# Patient Record
Sex: Male | Born: 1980 | Race: White | Hispanic: No | Marital: Single | Smoking: Current every day smoker
Health system: Southern US, Academic
[De-identification: ages and names within clinical notes are randomized; demographics above are authoritative.]

## PROBLEM LIST (undated history)

## (undated) DIAGNOSIS — Z789 Other specified health status: Secondary | ICD-10-CM

## (undated) HISTORY — PX: HX NO SURGICAL PROCEDURES: 2100001501

## (undated) HISTORY — PX: NO PAST SURGERIES: SHX2092

---

## 2009-12-22 ENCOUNTER — Emergency Department (HOSPITAL_COMMUNITY)
Admission: EM | Admit: 2009-12-22 | Discharge: 2009-12-22 | Payer: Self-pay | Source: Home / Self Care | Admitting: Emergency Medicine

## 2010-03-20 LAB — POCT I-STAT, CHEM 8
Calcium, Ion: 1.2 mmol/L (ref 1.12–1.32)
HCT: 47 % (ref 39.0–52.0)
TCO2: 31 mmol/L (ref 0–100)

## 2010-03-20 LAB — D-DIMER, QUANTITATIVE: D-Dimer, Quant: <0.22 ug/mL-FEU (ref 0.00–0.48)

## 2011-06-11 IMAGING — CR DG THORACIC SPINE 2V
4 series · 4 of 4 positions shown · non-contrast
Comparison: None

CLINICAL DATA: Mid back pain.

THORACIC SPINE - 2 VIEW

[view not recorded (1 of 4)]
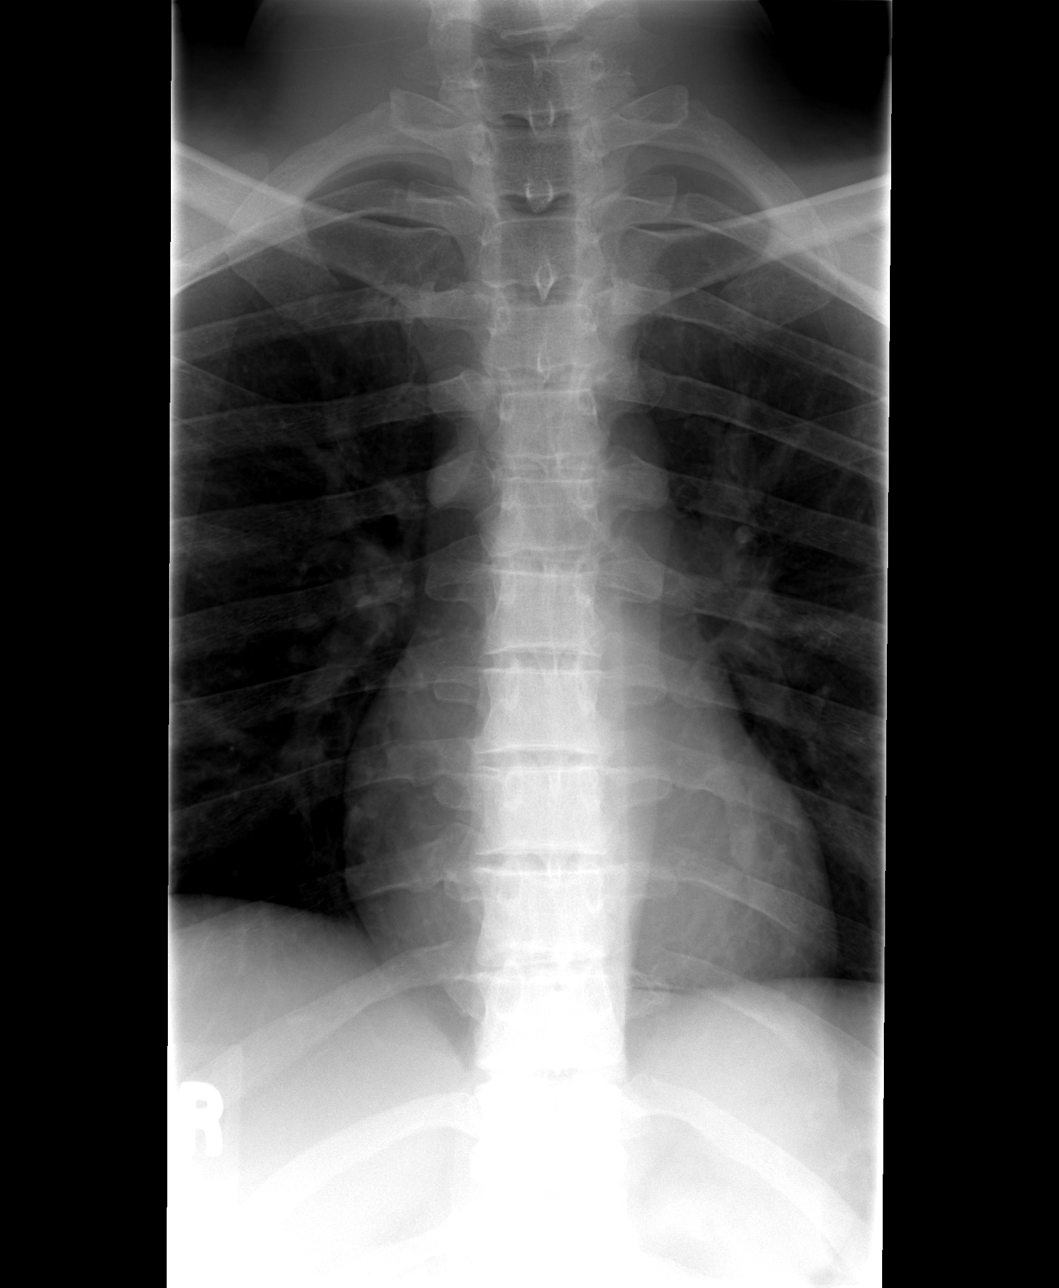

[view not recorded (2 of 4)]
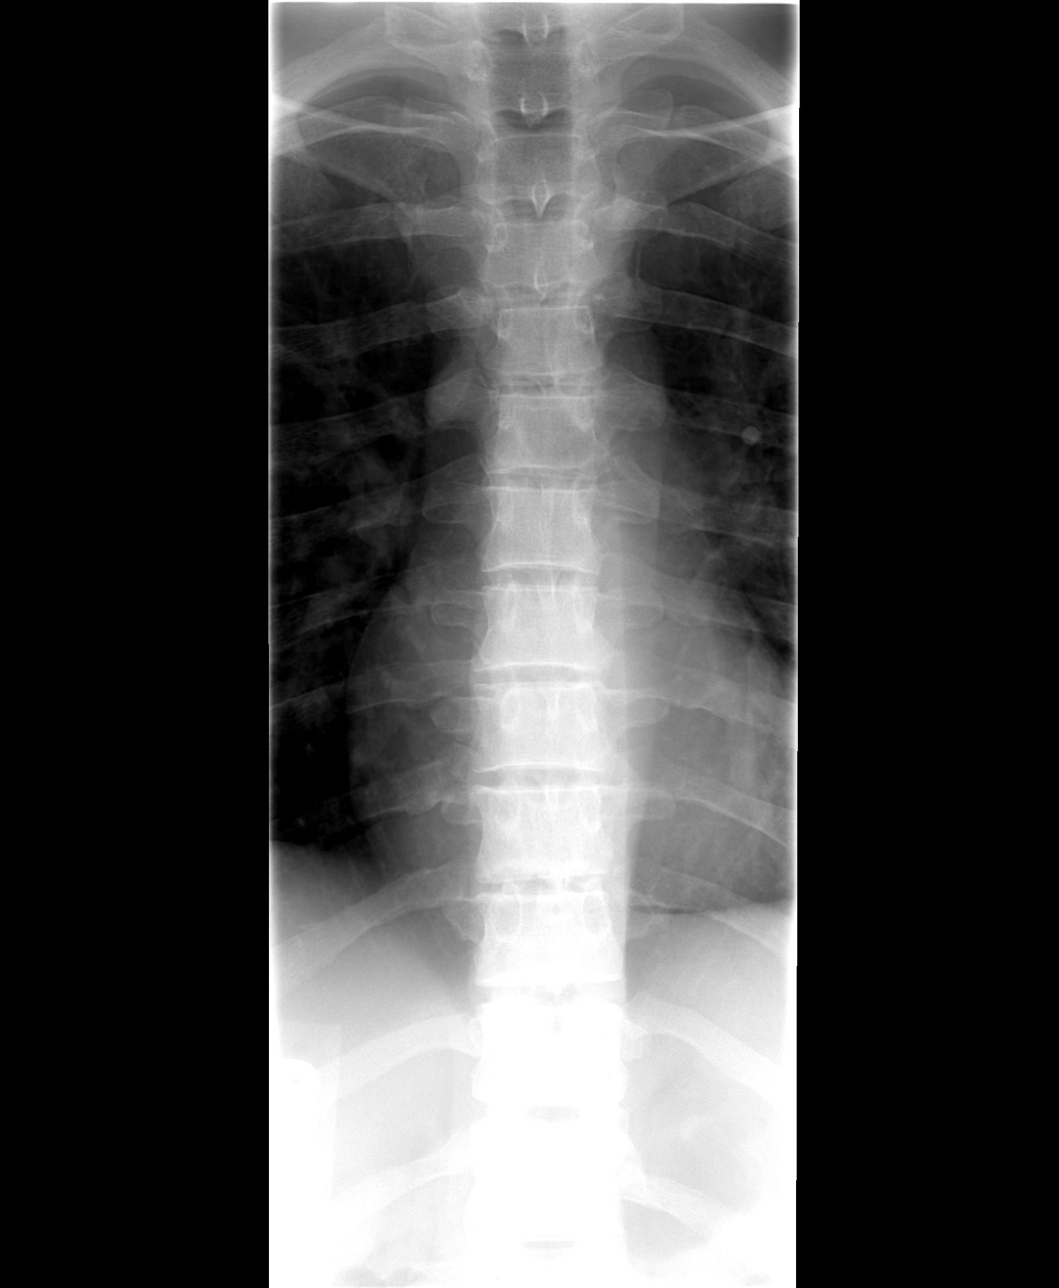

[view not recorded (3 of 4)]
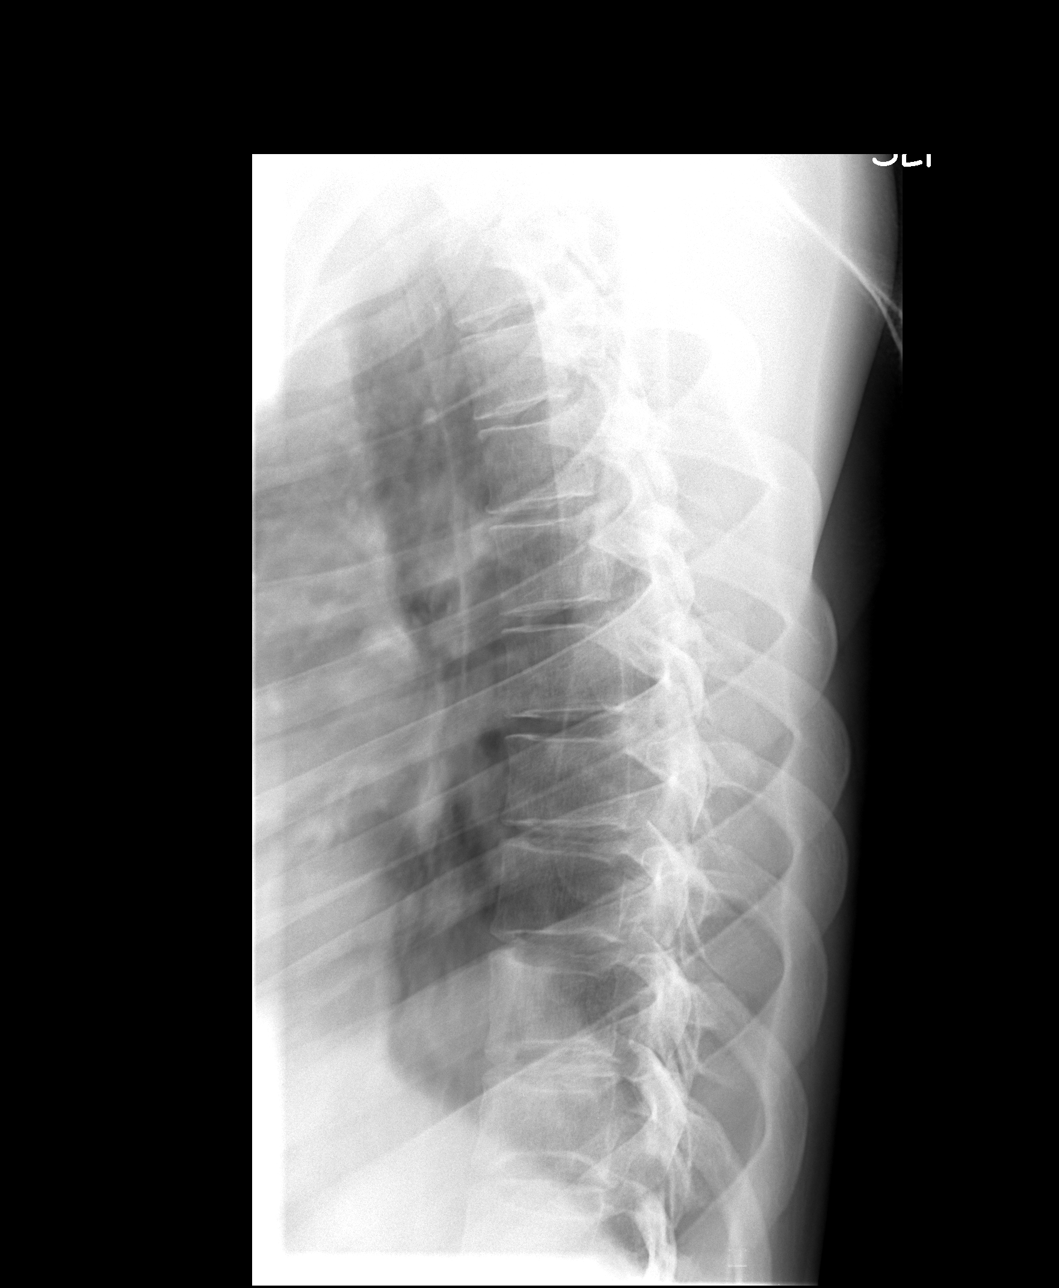

[view not recorded (4 of 4)]
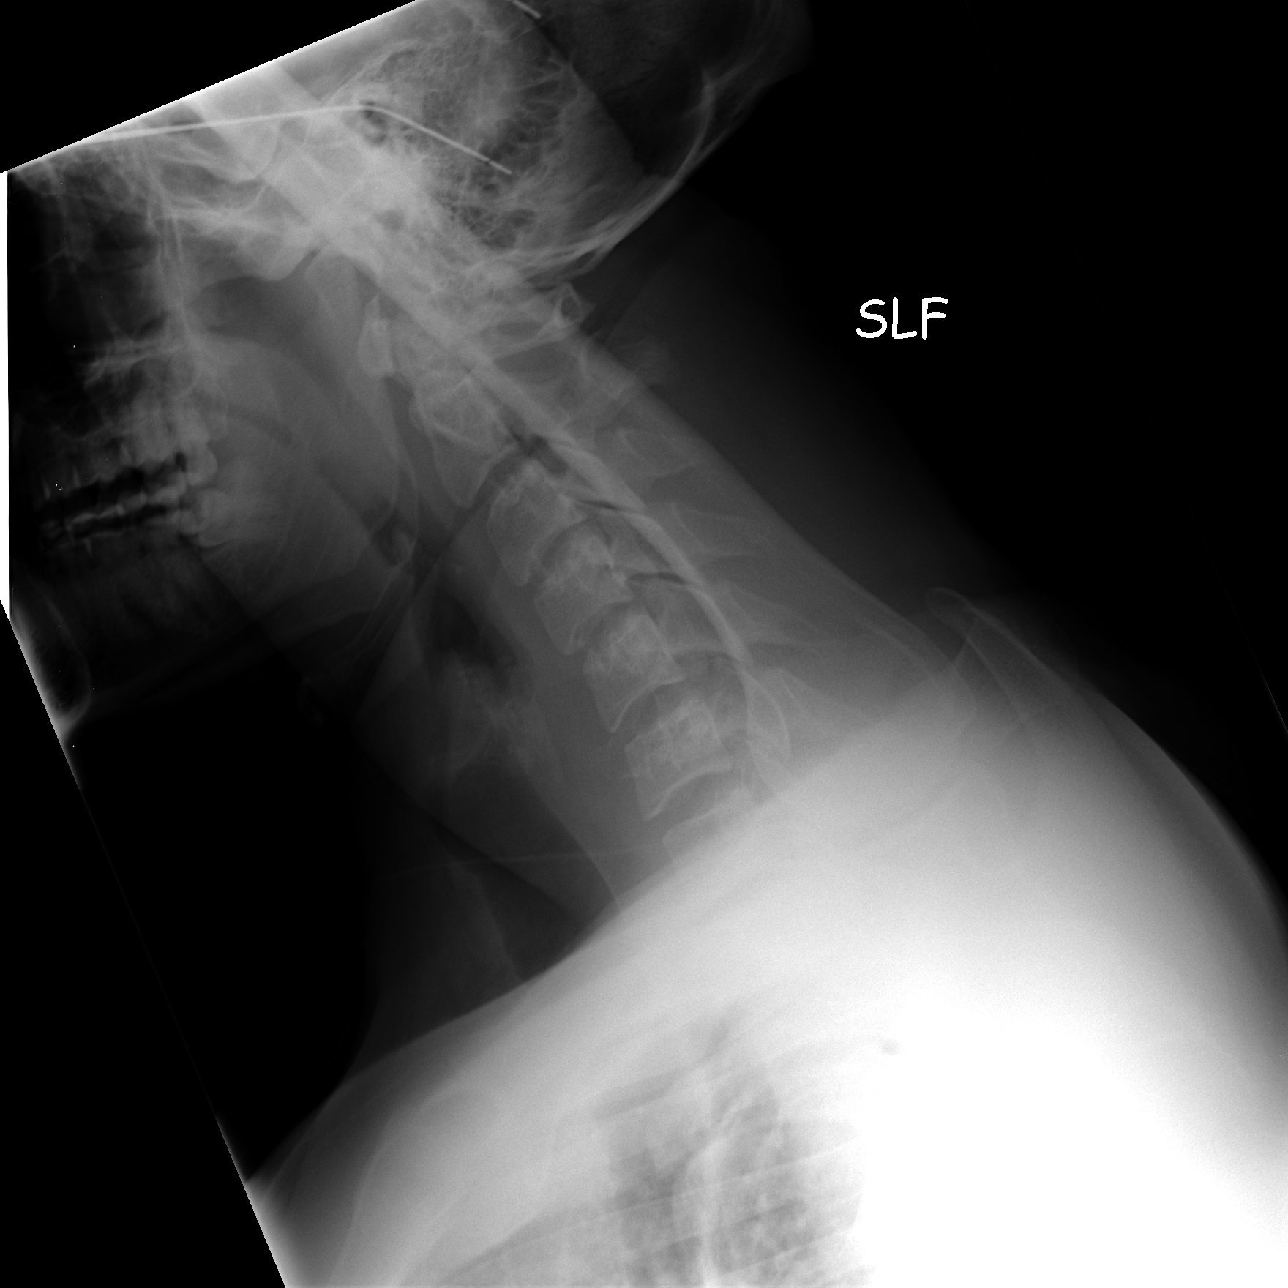

[4 of 4 positions shown; findings below may reference images not displayed]

FINDINGS: No acute bony abnormality.  Specifically, no fracture or
malalignment.  No significant degenerative disease.
IMPRESSION: Negative.

## 2014-01-12 ENCOUNTER — Emergency Department (HOSPITAL_COMMUNITY): Payer: 59

## 2014-01-12 ENCOUNTER — Emergency Department (HOSPITAL_COMMUNITY)
Admission: EM | Admit: 2014-01-12 | Discharge: 2014-01-12 | Disposition: A | Discharge status: Home / Self Care | Payer: 59 | Attending: Emergency Medicine | Admitting: Emergency Medicine

## 2014-01-12 ENCOUNTER — Encounter (HOSPITAL_COMMUNITY): Payer: Self-pay | Admitting: Emergency Medicine

## 2014-01-12 DIAGNOSIS — Y9389 Activity, other specified: Secondary | ICD-10-CM | POA: Insufficient documentation

## 2014-01-12 DIAGNOSIS — Y998 Other external cause status: Secondary | ICD-10-CM | POA: Diagnosis not present

## 2014-01-12 DIAGNOSIS — S51012A Laceration without foreign body of left elbow, initial encounter: Secondary | ICD-10-CM | POA: Diagnosis not present

## 2014-01-12 DIAGNOSIS — Y92410 Unspecified street and highway as the place of occurrence of the external cause: Secondary | ICD-10-CM | POA: Diagnosis not present

## 2014-01-12 DIAGNOSIS — S51002A Unspecified open wound of left elbow, initial encounter: Secondary | ICD-10-CM

## 2014-01-12 LAB — COMPREHENSIVE METABOLIC PANEL
ALT: 30 U/L (ref 0–53)
AST: 32 U/L (ref 0–37)
Albumin: 4.6 g/dL (ref 3.5–5.2)
Alkaline Phosphatase: 62 U/L (ref 39–117)
Anion gap: 10 (ref 5–15)
BUN: 21 mg/dL (ref 6–23)
CALCIUM: 9.3 mg/dL (ref 8.4–10.5)
CO2: 23 mmol/L (ref 19–32)
CREATININE: 1.26 mg/dL (ref 0.50–1.35)
Chloride: 104 mEq/L (ref 96–112)
GFR, EST AFRICAN AMERICAN: 86 mL/min — AB (ref >90)
GFR, EST NON AFRICAN AMERICAN: 74 mL/min — AB (ref >90)
GLUCOSE: 130 mg/dL — AB (ref 70–99)
Potassium: 3.8 mmol/L (ref 3.5–5.1)
Sodium: 137 mmol/L (ref 135–145)
TOTAL PROTEIN: 7.1 g/dL (ref 6.0–8.3)
Total Bilirubin: 0.7 mg/dL (ref 0.3–1.2)

## 2014-01-12 LAB — ETHANOL: Alcohol, Ethyl (B): <5 mg/dL (ref 0–9)

## 2014-01-12 MED ORDER — LIDOCAINE HCL (PF) 1 % IJ SOLN
INTRAMUSCULAR | Status: AC
  Filled 2014-01-12: qty 5

## 2014-01-12 MED ORDER — HYDROMORPHONE HCL 1 MG/ML IJ SOLN
1.0000 mg | Freq: Once | INTRAMUSCULAR | Status: AC
  Administered 2014-01-12: 1 mg via INTRAVENOUS

## 2014-01-12 MED ORDER — TETANUS-DIPHTH-ACELL PERTUSSIS 5-2.5-18.5 LF-MCG/0.5 IM SUSP
0.5000 mL | Freq: Once | INTRAMUSCULAR | Status: AC
  Administered 2014-01-12: 0.5 mL via INTRAMUSCULAR
  Filled 2014-01-12: qty 0.5

## 2014-01-12 MED ORDER — LIDOCAINE HCL (PF) 1 % IJ SOLN
30.0000 mL | Freq: Once | INTRAMUSCULAR | Status: AC
  Administered 2014-01-12: 30 mL via INTRADERMAL
  Filled 2014-01-12: qty 30

## 2014-01-12 MED ORDER — CEPHALEXIN 250 MG PO CAPS
500.0000 mg | ORAL_CAPSULE | Freq: Once | ORAL | Status: AC
  Administered 2014-01-12: 500 mg via ORAL
  Filled 2014-01-12: qty 2

## 2014-01-12 MED ORDER — HYDROMORPHONE HCL 1 MG/ML IJ SOLN
1.0000 mg | Freq: Once | INTRAMUSCULAR | Status: DC
  Filled 2014-01-12: qty 1

## 2014-01-12 MED ORDER — CEPHALEXIN 500 MG PO CAPS: 500.0000 mg | ORAL_CAPSULE | Freq: Two times a day (BID) | ORAL | Status: DC

## 2014-01-12 MED ORDER — SODIUM CHLORIDE 0.9 % IV BOLUS (SEPSIS)
1000.0000 mL | Freq: Once | INTRAVENOUS | Status: AC
  Administered 2014-01-12: 1000 mL via INTRAVENOUS

## 2014-01-12 MED ORDER — HYDROCODONE-ACETAMINOPHEN 5-325 MG PO TABS: 1.0000 | ORAL_TABLET | Freq: Four times a day (QID) | ORAL | Status: DC | PRN
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
## 2014-01-12 NOTE — ED Notes (Signed)
MD at bedside irrigating wound.

## 2014-01-12 NOTE — ED Notes (Signed)
Pt made aware to return if symptoms worsen or if any life threatening symptoms occur.   
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
## 2014-01-12 NOTE — ED Notes (Signed)
Wet to dry dressing applied.
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
## 2014-01-13 MED ORDER — HYDROCODONE-ACETAMINOPHEN 5-325 MG PO TABS: 2.0000 | ORAL_TABLET | Freq: Four times a day (QID) | ORAL | Status: DC | PRN

## 2014-01-22 ENCOUNTER — Ambulatory Visit (INDEPENDENT_AMBULATORY_CARE_PROVIDER_SITE_OTHER): Payer: 59 | Admitting: Emergency Medicine

## 2014-01-22 VITALS — BP 120/72 | HR 68 | Temp 98.0°F | Resp 18 | Ht 78.0 in | Wt 209.0 lb

## 2014-01-22 DIAGNOSIS — S51012A Laceration without foreign body of left elbow, initial encounter: Secondary | ICD-10-CM

## 2014-01-22 DIAGNOSIS — T8131XA Disruption of external operation (surgical) wound, not elsewhere classified, initial encounter: Secondary | ICD-10-CM

## 2014-01-22 MED ORDER — DAKINS 0.4-0.5 % EX SOLN: CUTANEOUS | Status: DC | PRN

## 2014-01-22 MED ORDER — HYDROCODONE-ACETAMINOPHEN 5-325 MG PO TABS: 2.0000 | ORAL_TABLET | ORAL | Status: DC | PRN

## 2014-01-22 NOTE — Patient Instructions (Signed)
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
## 2014-01-23 ENCOUNTER — Telehealth: Payer: Self-pay

## 2014-01-23 NOTE — Telephone Encounter (Signed)
Pt's wife tried to clean pt's wound last night with soap and water and he passed out from pain, this was after taking Vicodin first. Wants to know if she soul bring him in and let you clean it and evaluate him. Advised that this would be best. He will he here after lunch. Told clerical to fast track him
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
## 2014-01-23 NOTE — Telephone Encounter (Signed)
The patient did not come in today - please call and check on them.

## 2014-01-23 NOTE — Telephone Encounter (Signed)
Please call and get information for me.

## 2014-01-24 ENCOUNTER — Ambulatory Visit (INDEPENDENT_AMBULATORY_CARE_PROVIDER_SITE_OTHER): Payer: 59 | Admitting: Physician Assistant

## 2014-01-24 VITALS — BP 110/70 | HR 77 | Temp 98.1°F | Resp 18 | Ht 78.0 in | Wt 213.4 lb

## 2014-01-24 DIAGNOSIS — S51002S Unspecified open wound of left elbow, sequela: Secondary | ICD-10-CM

## 2014-01-24 DIAGNOSIS — T8133XD Disruption of traumatic injury wound repair, subsequent encounter: Secondary | ICD-10-CM

## 2014-01-24 NOTE — Progress Notes (Signed)
Procedure: Verbal consent obtained.  Patient anesthetized with roughly 10 cc of 0.5% Marcaine. The wound was scrubbed vigoursly with Chlorhexidine surgical scrub and then rinsed with normal saline. Necrotic tissue removed with a curette. Wound washed out with 60 cc syringe with splashed guard x 10. Wet dressing placed with sterile 4x4, and topped with clean 4x4 and non adhesive.  The area was wrapped with coban.  Patient tolerated procedure well.  Deliah BostonMichael Clark, MS, PA-C   10:27 AM, 01/24/2014
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
## 2014-01-25 NOTE — Telephone Encounter (Signed)
Pt was evaluated on Sunday.

## 2014-01-26 ENCOUNTER — Telehealth: Payer: Self-pay

## 2014-01-26 ENCOUNTER — Ambulatory Visit (INDEPENDENT_AMBULATORY_CARE_PROVIDER_SITE_OTHER): Payer: 59 | Admitting: Physician Assistant

## 2014-01-26 VITALS — BP 108/72 | HR 71 | Temp 98.2°F | Resp 18 | Ht 78.0 in | Wt 211.0 lb

## 2014-01-26 DIAGNOSIS — T8133XD Disruption of traumatic injury wound repair, subsequent encounter: Secondary | ICD-10-CM

## 2014-01-26 DIAGNOSIS — S51002S Unspecified open wound of left elbow, sequela: Secondary | ICD-10-CM

## 2014-01-26 NOTE — Progress Notes (Signed)
   Subjective:    Patient ID: Charles JeffersonMichael L Feld, male    DOB: 24-Feb-1980, 34 y.o.   MRN: [id number]  HPI Pt presents to clinic for a recheck.  They have been using the Dakins solution on the wound with good success.  Review of Systems  Constitutional: Negative for fever and chills.  Skin: Positive for wound.       Objective:   Physical Exam  Constitutional: He is oriented to person, place, and time. He appears well-developed and well-nourished.  BP 108/72 mmHg  Pulse 71  Temp(Src) 98.2 F (36.8 C) (Oral)  Resp 18  Ht 6\' 6"  (1.981 m)  Wt 211 lb (95.709 kg)  BMI 24.39 kg/m2  SpO2 97%   HENT:  Head: Normocephalic and atraumatic.  Right Ear: External ear normal.  Left Ear: External ear normal.  Pulmonary/Chest: Effort normal.  Neurological: He is alert and oriented to person, place, and time.  Skin: Skin is warm and dry.  Wound on left elbow - see photo in media - wound is 3x5cm.  Psychiatric: He has a normal mood and affect. His behavior is normal. Judgment and thought content normal.       Assessment & Plan:  Wound, open, elbow, left, sequela  Dehiscence of laceration repair, subsequent encounter   Continue Dakins solution wet-->dry drsgs.  We are going to refer to hand surgeon for debridement and closure.  Benny LennertSarah Yevonne Yokum PA-C  [facility] 01/26/2014 6:14 PM

## 2014-01-26 NOTE — Telephone Encounter (Signed)
Pt's wife called for Charles LennertSarah Atkins wanting to know about a referral concerning a skin graft. Please advise at [phone]

## 2014-01-28 ENCOUNTER — Other Ambulatory Visit (HOSPITAL_COMMUNITY): Payer: Self-pay | Admitting: Orthopaedic Surgery

## 2014-01-28 NOTE — Telephone Encounter (Signed)
appt for surgery Charles Atkins - on Tuesday (1/26) they are going to artificially graft skin to the area

## 2014-02-01 ENCOUNTER — Encounter (HOSPITAL_COMMUNITY): Payer: Self-pay | Admitting: *Deleted

## 2014-02-01 MED ORDER — CEFAZOLIN SODIUM-DEXTROSE 2-3 GM-% IV SOLR
2.0000 g | INTRAVENOUS | Status: AC
  Administered 2014-02-02: 2 g via INTRAVENOUS
  Filled 2014-02-01: qty 50

## 2014-02-02 ENCOUNTER — Ambulatory Visit (HOSPITAL_COMMUNITY): Payer: 59 | Admitting: Certified Registered Nurse Anesthetist

## 2014-02-02 ENCOUNTER — Encounter (HOSPITAL_COMMUNITY): Payer: Self-pay | Admitting: Certified Registered Nurse Anesthetist

## 2014-02-02 ENCOUNTER — Ambulatory Visit (HOSPITAL_COMMUNITY)
Admission: RE | Admit: 2014-02-02 | Discharge: 2014-02-02 | Disposition: A | Discharge status: Home / Self Care | Payer: 59 | Source: Ambulatory Visit | Attending: Orthopaedic Surgery | Admitting: Orthopaedic Surgery

## 2014-02-02 ENCOUNTER — Encounter (HOSPITAL_COMMUNITY)
Admission: RE | Disposition: A | Discharge status: Home / Self Care | Payer: Self-pay | Source: Ambulatory Visit | Attending: Orthopaedic Surgery

## 2014-02-02 DIAGNOSIS — L988 Other specified disorders of the skin and subcutaneous tissue: Secondary | ICD-10-CM

## 2014-02-02 DIAGNOSIS — S51802A Unspecified open wound of left forearm, initial encounter: Secondary | ICD-10-CM | POA: Diagnosis not present

## 2014-02-02 HISTORY — PX: SKIN SPLIT GRAFT: SHX444

## 2014-02-02 HISTORY — DX: Other specified health status: Z78.9

## 2014-02-02 LAB — CBC
HCT: 44.8 % (ref 39.0–52.0)
Hemoglobin: 15.3 g/dL (ref 13.0–17.0)
MCH: 29.8 pg (ref 26.0–34.0)
MCHC: 34.2 g/dL (ref 30.0–36.0)
MCV: 87.3 fL (ref 78.0–100.0)
Platelets: 256 10*3/uL (ref 150–400)
RBC: 5.13 MIL/uL (ref 4.22–5.81)
RDW: 12.8 % (ref 11.5–15.5)
WBC: 10.2 10*3/uL (ref 4.0–10.5)

## 2014-02-02 SURGERY — APPLICATION, GRAFT, SKIN, SPLIT-THICKNESS
Anesthesia: General | Laterality: Left

## 2014-02-02 MED ORDER — LACTATED RINGERS IV SOLN
INTRAVENOUS | Status: DC
  Administered 2014-02-02: 13:00:00 via INTRAVENOUS

## 2014-02-02 MED ORDER — ONDANSETRON HCL 4 MG/2ML IJ SOLN
INTRAMUSCULAR | Status: AC
  Filled 2014-02-02: qty 2

## 2014-02-02 MED ORDER — ONDANSETRON HCL 4 MG/2ML IJ SOLN
INTRAMUSCULAR | Status: DC | PRN
  Administered 2014-02-02: 4 mg via INTRAVENOUS

## 2014-02-02 MED ORDER — MIDAZOLAM HCL 2 MG/2ML IJ SOLN
INTRAMUSCULAR | Status: AC
  Filled 2014-02-02: qty 2

## 2014-02-02 MED ORDER — HYDROCODONE-ACETAMINOPHEN 5-325 MG PO TABS: 2.0000 | ORAL_TABLET | ORAL | Status: DC | PRN

## 2014-02-02 MED ORDER — PROPOFOL 10 MG/ML IV BOLUS
INTRAVENOUS | Status: DC | PRN
  Administered 2014-02-02: 200 mg via INTRAVENOUS
  Administered 2014-02-02: 100 mg via INTRAVENOUS
  Administered 2014-02-02: 50 mg via INTRAVENOUS

## 2014-02-02 MED ORDER — DEXAMETHASONE SODIUM PHOSPHATE 4 MG/ML IJ SOLN
INTRAMUSCULAR | Status: AC
  Filled 2014-02-02: qty 2

## 2014-02-02 MED ORDER — LACTATED RINGERS IV SOLN
INTRAVENOUS | Status: DC | PRN
  Administered 2014-02-02 (×2): via INTRAVENOUS

## 2014-02-02 MED ORDER — LIDOCAINE HCL (CARDIAC) 20 MG/ML IV SOLN
INTRAVENOUS | Status: DC | PRN
  Administered 2014-02-02: 100 mg via INTRAVENOUS

## 2014-02-02 MED ORDER — ONDANSETRON HCL 4 MG/2ML IJ SOLN: 4.0000 mg | Freq: Once | INTRAMUSCULAR | Status: DC | PRN

## 2014-02-02 MED ORDER — MIDAZOLAM HCL 5 MG/5ML IJ SOLN
INTRAMUSCULAR | Status: DC | PRN
  Administered 2014-02-02: 2 mg via INTRAVENOUS

## 2014-02-02 MED ORDER — FENTANYL CITRATE 0.05 MG/ML IJ SOLN
INTRAMUSCULAR | Status: DC | PRN
  Administered 2014-02-02 (×2): 50 ug via INTRAVENOUS

## 2014-02-02 MED ORDER — HYDROMORPHONE HCL 1 MG/ML IJ SOLN: 0.2500 mg | INTRAMUSCULAR | Status: DC | PRN

## 2014-02-02 MED ORDER — PROPOFOL 10 MG/ML IV BOLUS
INTRAVENOUS | Status: AC
  Filled 2014-02-02: qty 20

## 2014-02-02 MED ORDER — SODIUM CHLORIDE 0.9 % IR SOLN
Status: DC | PRN
  Administered 2014-02-02: 1000 mL

## 2014-02-02 MED ORDER — FENTANYL CITRATE 0.05 MG/ML IJ SOLN
INTRAMUSCULAR | Status: AC
  Filled 2014-02-02: qty 5

## 2014-02-02 MED ORDER — DEXAMETHASONE SODIUM PHOSPHATE 4 MG/ML IJ SOLN
INTRAMUSCULAR | Status: DC | PRN
  Administered 2014-02-02: 8 mg via INTRAVENOUS

## 2014-02-02 SURGICAL SUPPLY — 34 items
BANDAGE ELASTIC 4 VELCRO ST LF (GAUZE/BANDAGES/DRESSINGS) ×2 IMPLANT
BNDG COHESIVE 4X5 TAN STRL (GAUZE/BANDAGES/DRESSINGS) ×2 IMPLANT
BNDG GAUZE ELAST 4 BULKY (GAUZE/BANDAGES/DRESSINGS) ×4 IMPLANT
CANISTER SUCTION 2500CC (MISCELLANEOUS) IMPLANT
COVER SURGICAL LIGHT HANDLE (MISCELLANEOUS) ×2 IMPLANT
DRAPE U-SHAPE 47X51 STRL (DRAPES) ×2 IMPLANT
DRSG PAD ABDOMINAL 8X10 ST (GAUZE/BANDAGES/DRESSINGS) ×2 IMPLANT
GAUZE SPONGE 4X4 12PLY STRL (GAUZE/BANDAGES/DRESSINGS) IMPLANT
GAUZE XEROFORM 5X9 LF (GAUZE/BANDAGES/DRESSINGS) ×2 IMPLANT
GLOVE BIO SURGEON STRL SZ8 (GLOVE) ×2 IMPLANT
GLOVE BIOGEL PI IND STRL 8 (GLOVE) ×1 IMPLANT
GLOVE BIOGEL PI INDICATOR 8 (GLOVE) ×1
GLOVE ORTHO TXT STRL SZ7.5 (GLOVE) ×2 IMPLANT
GOWN STRL REUS W/ TWL LRG LVL3 (GOWN DISPOSABLE) ×1 IMPLANT
GOWN STRL REUS W/ TWL XL LVL3 (GOWN DISPOSABLE) ×4 IMPLANT
GOWN STRL REUS W/TWL LRG LVL3 (GOWN DISPOSABLE) ×1
GOWN STRL REUS W/TWL XL LVL3 (GOWN DISPOSABLE) ×4
KIT BASIN OR (CUSTOM PROCEDURE TRAY) ×2 IMPLANT
KIT ROOM TURNOVER OR (KITS) ×2 IMPLANT
MANIFOLD NEPTUNE II (INSTRUMENTS) IMPLANT
NS IRRIG 1000ML POUR BTL (IV SOLUTION) ×4 IMPLANT
PACK ORTHO EXTREMITY (CUSTOM PROCEDURE TRAY) ×2 IMPLANT
PAD ARMBOARD 7.5X6 YLW CONV (MISCELLANEOUS) ×2 IMPLANT
PADDING CAST COTTON 6X4 STRL (CAST SUPPLIES) IMPLANT
SPONGE GAUZE 4X4 12PLY STER LF (GAUZE/BANDAGES/DRESSINGS) ×2 IMPLANT
STOCKINETTE IMPERVIOUS 9X36 MD (GAUZE/BANDAGES/DRESSINGS) ×2 IMPLANT
SUT MNCRL AB 4-0 PS2 18 (SUTURE) ×4 IMPLANT
SUT VICRYL 4-0 PS2 18IN ABS (SUTURE) ×2 IMPLANT
TISSUE THERASKIN 2X3 (Tissue) ×2 IMPLANT
TOWEL OR 17X24 6PK STRL BLUE (TOWEL DISPOSABLE) ×2 IMPLANT
TOWEL OR 17X26 10 PK STRL BLUE (TOWEL DISPOSABLE) ×2 IMPLANT
TUBE CONNECTING 12X1/4 (SUCTIONS) IMPLANT
UNDERPAD 30X30 INCONTINENT (UNDERPADS AND DIAPERS) ×2 IMPLANT
WATER STERILE IRR 1000ML POUR (IV SOLUTION) IMPLANT

## 2014-02-02 NOTE — Anesthesia Postprocedure Evaluation (Signed)
  Anesthesia Post-op Note  Patient: Charles JeffersonMichael L Bitner  Procedure(s) Performed: Procedure(s): THERASKIN SPLIT THICKNESS SKIN GRAFT TO LEFT ELBOW (Left)  Patient Location: PACU  Anesthesia Type:General  Level of Consciousness: awake, alert , oriented and patient cooperative  Airway and Oxygen Therapy: Patient Spontanous Breathing  Post-op Pain: mild  Post-op Assessment: Post-op Vital signs reviewed, Patient's Cardiovascular Status Stable, Respiratory Function Stable, Patent Airway, No signs of Nausea or vomiting and Pain level controlled  Post-op Vital Signs: stable  Last Vitals:  Filed Vitals:   02/02/14 1229  BP: 138/70  Pulse: 67  Temp: 37 C  Resp: 18    Complications: No apparent anesthesia complications

## 2014-02-02 NOTE — Brief Op Note (Signed)
02/02/2014  3:10 PM  PATIENT:  Charles Atkins  34 y.o. male  PRE-OPERATIVE DIAGNOSIS:  left elbow full-thickness wound  POST-OPERATIVE DIAGNOSIS:  left elbow full-thickness wound  PROCEDURE:  Procedure(s): THERASKIN SPLIT THICKNESS SKIN GRAFT TO LEFT ELBOW (Left)  SURGEON:  Surgeon(s) and Role:    * Kathryne Hitchhristopher Y Kymari Lollis, MD - Primary  PHYSICIAN ASSISTANT: Rexene EdisonGil Clark, PA-C  ANESTHESIA:   general  EBL:  Total I/O In: 1000 [I.V.:1000] Out: -   BLOOD ADMINISTERED:none  DRAINS: none   LOCAL MEDICATIONS USED:  NONE  SPECIMEN:  No Specimen  DISPOSITION OF SPECIMEN:  N/A  COUNTS:  YES  TOURNIQUET:    DICTATION: .Other Dictation: Dictation Number [id number]  PLAN OF CARE: Discharge to home after PACU  PATIENT DISPOSITION:  PACU - hemodynamically stable.   Delay start of Pharmacological VTE agent (>24hrs) due to surgical blood loss or risk of bleeding: no
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
## 2014-02-02 NOTE — Anesthesia Procedure Notes (Signed)
Procedure Name: LMA Insertion Date/Time: 02/02/2014 2:38 PM Performed by: Rise PatienceBELL, Sarely Stracener T Pre-anesthesia Checklist: Patient identified, Emergency Drugs available, Suction available, Patient being monitored and Timeout performed Patient Re-evaluated:Patient Re-evaluated prior to inductionOxygen Delivery Method: Circle system utilized Preoxygenation: Pre-oxygenation with 100% oxygen Intubation Type: IV induction LMA: LMA inserted LMA Size: 5.0 Number of attempts: 1 Placement Confirmation: positive ETCO2 and breath sounds checked- equal and bilateral Tube secured with: Tape Dental Injury: Teeth and Oropharynx as per pre-operative assessment

## 2014-02-02 NOTE — Transfer of Care (Signed)
Immediate Anesthesia Transfer of Care Note  Patient: Charles JeffersonMichael L Quintela  Procedure(s) Performed: Procedure(s): [facility] SKIN GRAFT TO LEFT ELBOW (Left)  Patient Location: PACU  Anesthesia Type:General  Level of Consciousness: awake, alert , oriented and patient cooperative  Airway & Oxygen Therapy: Patient Spontanous Breathing and Patient connected to nasal cannula oxygen  Post-op Assessment: Report given to PACU RN and Post -op Vital signs reviewed and stable  Post vital signs: Reviewed  Complications: No apparent anesthesia complications

## 2014-02-02 NOTE — Discharge Instructions (Signed)
Keep your left arm dressing clean and dry. Leave your dressing in place until your follow-up next week. Limit excessive bending of your left elbow.  What to eat:  For your first meals, you should eat lightly; only small meals initially.  If you do not have nausea, you may eat larger meals.  Avoid spicy, greasy and heavy food.    General Anesthesia, Adult, Care After  Refer to this sheet in the next few weeks. These instructions provide you with information on caring for yourself after your procedure. Your health care provider may also give you more specific instructions. Your treatment has been planned according to current medical practices, but problems sometimes occur. Call your health care provider if you have any problems or questions after your procedure.  WHAT TO EXPECT AFTER THE PROCEDURE  After the procedure, it is typical to experience:  Sleepiness.  Nausea and vomiting. HOME CARE INSTRUCTIONS  For the first 24 hours after general anesthesia:  Have a responsible person with you.  Do not drive a car. If you are alone, do not take public transportation.  Do not drink alcohol.  Do not take medicine that has not been prescribed by your health care provider.  Do not sign important papers or make important decisions.  You may resume a normal diet and activities as directed by your health care provider.  Change bandages (dressings) as directed.  If you have questions or problems that seem related to general anesthesia, call the hospital and ask for the anesthetist or anesthesiologist on call. SEEK MEDICAL CARE IF:  You have nausea and vomiting that continue the day after anesthesia.  You develop a rash. SEEK IMMEDIATE MEDICAL CARE IF:  You have difficulty breathing.  You have chest pain.  You have any allergic problems. Document Released: 04/02/2000 Document Revised: 08/27/2012 Document Reviewed: 07/10/2012  [facility] Patient Information 2014 [place].

## 2014-02-02 NOTE — Anesthesia Preprocedure Evaluation (Addendum)
Anesthesia Evaluation  Patient identified by MRN, date of birth, ID band Patient awake    Reviewed: Allergy & Precautions, NPO status , Patient's Chart, lab work & pertinent test results  History of Anesthesia Complications Negative for: history of anesthetic complications  Airway Mallampati: I  TM Distance: >3 FB Neck ROM: Full    Dental  (+) Dental Advisory Given,    Pulmonary neg pulmonary ROS,    Pulmonary exam normal       Cardiovascular negative cardio ROS      Neuro/Psych negative neurological ROS     GI/Hepatic negative GI ROS, Neg liver ROS,   Endo/Other  negative endocrine ROS  Renal/GU negative Renal ROS  negative genitourinary   Musculoskeletal   Abdominal   Peds  Hematology negative hematology ROS (+)   Anesthesia Other Findings   Reproductive/Obstetrics                            Anesthesia Physical Anesthesia Plan  ASA: I  Anesthesia Plan: General   Post-op Pain Management:    Induction: Intravenous  Airway Management Planned: LMA  Additional Equipment:   Intra-op Plan:   Post-operative Plan: Extubation in OR  Informed Consent: I have reviewed the patients History and Physical, chart, labs and discussed the procedure including the risks, benefits and alternatives for the proposed anesthesia with the patient or authorized representative who has indicated his/her understanding and acceptance.   Dental advisory given  Plan Discussed with: CRNA, Anesthesiologist and Surgeon  Anesthesia Plan Comments:        Anesthesia Quick Evaluation

## 2014-02-04 ENCOUNTER — Encounter (HOSPITAL_COMMUNITY): Payer: Self-pay | Admitting: Orthopaedic Surgery
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
## 2014-07-30 ENCOUNTER — Emergency Department (HOSPITAL_BASED_OUTPATIENT_CLINIC_OR_DEPARTMENT_OTHER)
Admission: EM | Admit: 2014-07-30 | Discharge: 2014-07-30 | Disposition: A | Discharge status: Home / Self Care | Payer: 59 | Attending: Emergency Medicine | Admitting: Emergency Medicine

## 2014-07-30 ENCOUNTER — Encounter (HOSPITAL_BASED_OUTPATIENT_CLINIC_OR_DEPARTMENT_OTHER): Payer: Self-pay

## 2014-07-30 DIAGNOSIS — R220 Localized swelling, mass and lump, head: Secondary | ICD-10-CM | POA: Diagnosis present

## 2014-07-30 DIAGNOSIS — R61 Generalized hyperhidrosis: Secondary | ICD-10-CM | POA: Diagnosis not present

## 2014-07-30 DIAGNOSIS — K047 Periapical abscess without sinus: Secondary | ICD-10-CM | POA: Diagnosis not present

## 2014-07-30 MED ORDER — PENICILLIN V POTASSIUM 500 MG PO TABS: 500.0000 mg | ORAL_TABLET | Freq: Three times a day (TID) | ORAL | Status: AC

## 2014-07-30 MED ORDER — HYDROCODONE-ACETAMINOPHEN 5-325 MG PO TABS: 1.0000 | ORAL_TABLET | Freq: Four times a day (QID) | ORAL | Status: AC | PRN

## 2014-07-30 NOTE — ED Notes (Signed)
Hx of poor dental hygiene-swelling to left lower jaw x 6 days-has been taking left over clindamycin x 2 days

## 2014-07-30 NOTE — ED Provider Notes (Signed)
CSN: [id number]     Arrival date & time 07/30/14  2040 History  This chart was scribed for Geoffery Lyons, MD by Lyndel Safe, ED Scribe. This patient was seen in room MH02/MH02 and the patient's care was started 8:53 PM.   Chief Complaint  Patient presents with  . Facial Swelling   The history is provided by the patient. No language interpreter was used.   HPI Comments: Charles Atkins is a 34 y.o. male, with a PMhx of poor dental hygiene, who presents to the Emergency Department complaining of gradually worsening, constant left-sided facial swelling and generalized jaw pain onset 6 days. He reports an episode of diaphoresis last night. Pt states he has been consuming a normal amount of fluids but due to the exacerbation of his pain when chewing he has not been eating as much. He notes poor dentition throughout his life. Pt states his wife is followed by a dentist with whom he plans to follow up with. Denies fever, trouble swallowing, or dental fractures.   Past Medical History  Diagnosis Date  . Medical history non-contributory    Past Surgical History  Procedure Laterality Date  . No past surgeries    . Skin split graft Left 02/02/2014    Procedure: [facility] SPLIT THICKNESS SKIN GRAFT TO LEFT ELBOW;  Surgeon: Kathryne Hitch, MD;  Location: [facility] OR;  Service: Orthopedics;  Laterality: Left;   No family history on file. History  Substance Use Topics  . Smoking status: Never Smoker   . Smokeless tobacco: Not on file  . Alcohol Use: No    Review of Systems  Constitutional: Positive for diaphoresis. Negative for fever.  HENT: Positive for dental problem and facial swelling. Negative for trouble swallowing.   Musculoskeletal: Positive for arthralgias.   A complete 10 system review of systems was obtained and is otherwise negative except at noted in the HPI and PMH.  Allergies  Review of patient's allergies indicates no known allergies.  Home Medications   Prior to  Admission medications   Medication Sig Start Date End Date Taking? Authorizing Provider  HYDROcodone-acetaminophen (NORCO/VICODIN) 5-325 MG per tablet Take 2 tablets by mouth every 4 (four) hours as needed for moderate pain. 02/02/14   Kathryne Hitch, MD   BP 133/87 mmHg  Pulse 90  Temp(Src) 99.1 F (37.3 C) (Oral)  Resp 20  Ht 6' 0.5" (1.842 m)  Wt 215 lb (97.523 kg)  BMI 28.74 kg/m2  SpO2 98% Physical Exam  Constitutional: He appears well-developed and well-nourished. No distress.  HENT:  Head: Normocephalic and atraumatic.  Mouth/Throat: Oropharynx is clear and moist. No oropharyngeal exudate.  TTP and swelling to the left lower jaw. No submental crepitus or swelling. He has poor dentition throughout but no obvious intraoral abscess.   Eyes: Conjunctivae are normal. Right eye exhibits no discharge. Left eye exhibits no discharge. No scleral icterus.  Neck: No JVD present.  Pulmonary/Chest: Effort normal. No respiratory distress.  Neurological: He is alert. Coordination normal.  Skin: Skin is warm. No rash noted. No erythema. No pallor.  Psychiatric: He has a normal mood and affect. His behavior is normal.  Nursing note and vitals reviewed.  ED Course  Procedures  DIAGNOSTIC STUDIES: Oxygen Saturation is 98% on RA, normal by my interpretation.    COORDINATION OF CARE: 8:58 PM Discussed treatment plan which includes to start pt on abx course and give referral to dentist. Pt acknowledges and agrees to plan.   Labs Review Labs Reviewed -  No data to display  Imaging Review No results found.   EKG Interpretation None      MDM   Final diagnoses:  None    Antibiotics, pain meds, follow up with dentistry.  No difficulty swallowing, trismus, or findings concerning for Ludwig's.  I personally performed the services described in this documentation, which was scribed in my presence. The recorded information has been reviewed and is accurate.       Geoffery Lyons, MD 07/30/14 2228

## 2014-07-30 NOTE — Discharge Instructions (Signed)
Penicillin as prescribed.  Hydrocodone as prescribed as needed for pain.  Follow-up with dentistry on Monday, and return to the ER if you develop and inability to swallow or difficulty breathing.   Dental Abscess A dental abscess is a collection of infected fluid (pus) from a bacterial infection in the inner part of the tooth (pulp). It usually occurs at the end of the tooth's root.  CAUSES   Severe tooth decay.  Trauma to the tooth that allows bacteria to enter into the pulp, such as a broken or chipped tooth. SYMPTOMS   Severe pain in and around the infected tooth.  Swelling and redness around the abscessed tooth or in the mouth or face.  Tenderness.  Pus drainage.  Bad breath.  Bitter taste in the mouth.  Difficulty swallowing.  Difficulty opening the mouth.  Nausea.  Vomiting.  Chills.  Swollen neck glands. DIAGNOSIS   A medical and dental history will be taken.  An examination will be performed by tapping on the abscessed tooth.  X-rays may be taken of the tooth to identify the abscess. TREATMENT The goal of treatment is to eliminate the infection. You may be prescribed antibiotic medicine to stop the infection from spreading. A root canal may be performed to save the tooth. If the tooth cannot be saved, it may be pulled (extracted) and the abscess may be drained.  HOME CARE INSTRUCTIONS  Only take over-the-counter or prescription medicines for pain, fever, or discomfort as directed by your caregiver.  Rinse your mouth (gargle) often with salt water ( tsp salt in 8 oz [250 ml] of warm water) to relieve pain or swelling.  Do not drive after taking pain medicine (narcotics).  Do not apply heat to the outside of your face.  Return to your dentist for further treatment as directed. SEEK MEDICAL CARE IF:  Your pain is not helped by medicine.  Your pain is getting worse instead of better. SEEK IMMEDIATE MEDICAL CARE IF:  You have a fever or persistent  symptoms for more than 2-3 days.  You have a fever and your symptoms suddenly get worse.  You have chills or a very bad headache.  You have problems breathing or swallowing.  You have trouble opening your mouth.  You have swelling in the neck or around the eye. Document Released: 12/25/2004 Document Revised: 09/19/2011 Document Reviewed: 04/04/2010 [facility] Patient Information 2015 [place], [place]. This information is not intended to replace advice given to you by your health care provider. Make sure you discuss any questions you have with your health care provider.

## 2014-08-02 ENCOUNTER — Emergency Department (HOSPITAL_COMMUNITY): Payer: 59

## 2014-08-02 ENCOUNTER — Encounter (HOSPITAL_COMMUNITY): Payer: Self-pay | Admitting: Emergency Medicine

## 2014-08-02 ENCOUNTER — Emergency Department (HOSPITAL_COMMUNITY)
Admission: EM | Admit: 2014-08-02 | Discharge: 2014-08-02 | Disposition: A | Discharge status: Home / Self Care | Payer: 59 | Attending: Emergency Medicine | Admitting: Emergency Medicine

## 2014-08-02 DIAGNOSIS — K047 Periapical abscess without sinus: Secondary | ICD-10-CM | POA: Diagnosis not present

## 2014-08-02 DIAGNOSIS — K088 Other specified disorders of teeth and supporting structures: Secondary | ICD-10-CM | POA: Diagnosis present

## 2014-08-02 DIAGNOSIS — R000 Tachycardia, unspecified: Secondary | ICD-10-CM | POA: Insufficient documentation

## 2014-08-02 DIAGNOSIS — Z792 Long term (current) use of antibiotics: Secondary | ICD-10-CM | POA: Diagnosis not present

## 2014-08-02 DIAGNOSIS — R112 Nausea with vomiting, unspecified: Secondary | ICD-10-CM | POA: Diagnosis not present

## 2014-08-02 LAB — BASIC METABOLIC PANEL
Anion gap: 13 (ref 5–15)
BUN: 18 mg/dL (ref 6–20)
CO2: 23 mmol/L (ref 22–32)
Calcium: 9.3 mg/dL (ref 8.9–10.3)
Chloride: 101 mmol/L (ref 101–111)
Creatinine, Ser: 1.35 mg/dL — ABNORMAL HIGH (ref 0.61–1.24)
GFR calc Af Amer: >60 mL/min (ref >60)
GFR calc non Af Amer: >60 mL/min (ref >60)
Glucose, Bld: 83 mg/dL (ref 65–99)
POTASSIUM: 3.4 mmol/L — AB (ref 3.5–5.1)
Sodium: 137 mmol/L (ref 135–145)

## 2014-08-02 LAB — CBC WITH DIFFERENTIAL/PLATELET
BASOS PCT: 0 % (ref 0–1)
Basophils Absolute: 0 10*3/uL (ref 0.0–0.1)
EOS PCT: 0 % (ref 0–5)
Eosinophils Absolute: 0 10*3/uL (ref 0.0–0.7)
HCT: 43.5 % (ref 39.0–52.0)
Hemoglobin: 15.3 g/dL (ref 13.0–17.0)
Lymphocytes Relative: 6 % — ABNORMAL LOW (ref 12–46)
Lymphs Abs: 0.5 10*3/uL — ABNORMAL LOW (ref 0.7–4.0)
MCH: 31 pg (ref 26.0–34.0)
MCHC: 35.2 g/dL (ref 30.0–36.0)
MCV: 88.1 fL (ref 78.0–100.0)
Monocytes Absolute: 0.1 10*3/uL (ref 0.1–1.0)
Monocytes Relative: 1 % — ABNORMAL LOW (ref 3–12)
NEUTROS PCT: 93 % — AB (ref 43–77)
Neutro Abs: 7.7 10*3/uL (ref 1.7–7.7)
PLATELETS: 224 10*3/uL (ref 150–400)
RBC: 4.94 MIL/uL (ref 4.22–5.81)
RDW: 12.5 % (ref 11.5–15.5)
WBC: 8.3 10*3/uL (ref 4.0–10.5)

## 2014-08-02 LAB — I-STAT CG4 LACTIC ACID, ED: Lactic Acid, Venous: 4.07 mmol/L (ref 0.5–2.0)

## 2014-08-02 MED ORDER — CLINDAMYCIN HCL 300 MG PO CAPS: 300.0000 mg | ORAL_CAPSULE | Freq: Four times a day (QID) | ORAL | Status: AC

## 2014-08-02 MED ORDER — ONDANSETRON HCL 4 MG/2ML IJ SOLN
4.0000 mg | Freq: Once | INTRAMUSCULAR | Status: AC
  Administered 2014-08-02: 4 mg via INTRAVENOUS
  Filled 2014-08-02: qty 2

## 2014-08-02 MED ORDER — IOHEXOL 300 MG/ML  SOLN
80.0000 mL | Freq: Once | INTRAMUSCULAR | Status: AC | PRN
  Administered 2014-08-02: 80 mL via INTRAVENOUS

## 2014-08-02 MED ORDER — MORPHINE SULFATE 4 MG/ML IJ SOLN
4.0000 mg | Freq: Once | INTRAMUSCULAR | Status: AC
Start: 2014-08-02 — End: 2014-08-02
  Administered 2014-08-02: 4 mg via INTRAVENOUS
  Filled 2014-08-02: qty 1

## 2014-08-02 MED ORDER — CLINDAMYCIN PHOSPHATE 600 MG/50ML IV SOLN
600.0000 mg | Freq: Once | INTRAVENOUS | Status: AC
  Administered 2014-08-02: 600 mg via INTRAVENOUS
  Filled 2014-08-02: qty 50

## 2014-08-02 MED ORDER — OXYCODONE-ACETAMINOPHEN 5-325 MG PO TABS: 2.0000 | ORAL_TABLET | ORAL | Status: AC | PRN

## 2014-08-02 MED ORDER — SODIUM CHLORIDE 0.9 % IV BOLUS (SEPSIS)
1000.0000 mL | Freq: Once | INTRAVENOUS | Status: AC
  Administered 2014-08-02: 1000 mL via INTRAVENOUS

## 2014-08-02 NOTE — ED Notes (Signed)
Patient with dental pain and abscess, patient was seen at [facility] and was given PCN and the abscess has grown in size, patient with rigors and chills, nausea and vomiting after waking from sleep.

## 2014-08-02 NOTE — ED Notes (Signed)
Dr Norlene Campbell given a copy of lactic acid results 4.07

## 2014-08-02 NOTE — Discharge Instructions (Signed)
Your CT scan has shown an abscess adjacent to your left first molar.  You also have lymphadenopathy, which is swollen lymph nodes.  Is important for you to see a dentist in the next 24-48 hours.  Continue antibiotics.  Take pain medicine as prescribed.   Dental Abscess A dental abscess is a collection of infected fluid (pus) from a bacterial infection in the inner part of the tooth (pulp). It usually occurs at the end of the tooth's root.  CAUSES   Severe tooth decay.  Trauma to the tooth that allows bacteria to enter into the pulp, such as a broken or chipped tooth. SYMPTOMS   Severe pain in and around the infected tooth.  Swelling and redness around the abscessed tooth or in the mouth or face.  Tenderness.  Pus drainage.  Bad breath.  Bitter taste in the mouth.  Difficulty swallowing.  Difficulty opening the mouth.  Nausea.  Vomiting.  Chills.  Swollen neck glands. DIAGNOSIS   A medical and dental history will be taken.  An examination will be performed by tapping on the abscessed tooth.  X-rays may be taken of the tooth to identify the abscess. TREATMENT The goal of treatment is to eliminate the infection. You may be prescribed antibiotic medicine to stop the infection from spreading. A root canal may be performed to save the tooth. If the tooth cannot be saved, it may be pulled (extracted) and the abscess may be drained.  HOME CARE INSTRUCTIONS  Only take over-the-counter or prescription medicines for pain, fever, or discomfort as directed by your caregiver.  Rinse your mouth (gargle) often with salt water ( tsp salt in 8 oz [250 ml] of warm water) to relieve pain or swelling.  Do not drive after taking pain medicine (narcotics).  Do not apply heat to the outside of your face.  Return to your dentist for further treatment as directed. SEEK MEDICAL CARE IF:  Your pain is not helped by medicine.  Your pain is getting worse instead of better. SEEK  IMMEDIATE MEDICAL CARE IF:  You have a fever or persistent symptoms for more than 2-3 days.  You have a fever and your symptoms suddenly get worse.  You have chills or a very bad headache.  You have problems breathing or swallowing.  You have trouble opening your mouth.  You have swelling in the neck or around the eye. Document Released: 12/25/2004 Document Revised: 09/19/2011 Document Reviewed: 04/04/2010 [facility] Patient Information 2015 [place], [place]. This information is not intended to replace advice given to you by your health care provider. Make sure you discuss any questions you have with your health care provider.   Emergency Department Resource Guide 1) Find a Doctor and Pay Out of Pocket Although you won't have to find out who is covered by your insurance plan, it is a good idea to ask around and get recommendations. You will then need to call the office and see if the doctor you have chosen will accept you as a new patient and what types of options they offer for patients who are self-pay. Some doctors offer discounts or will set up payment plans for their patients who do not have insurance, but you will need to ask so you aren't surprised when you get to your appointment.  2) Contact Your Local Health Department Not all health departments have doctors that can see patients for sick visits, but many do, so it is worth a call to see if yours does. If you  don't know where your local health department is, you can check in your phone book. The CDC also has a tool to help you locate your state's health department, and many state websites also have listings of all of their local health departments.  3) Find a Walk-in Clinic If your illness is not likely to be very severe or complicated, you may want to try a walk in clinic. These are popping up all over the country in pharmacies, drugstores, and shopping centers. They're usually staffed by nurse practitioners or physician assistants  that have been trained to treat common illnesses and complaints. They're usually fairly quick and inexpensive. However, if you have serious medical issues or chronic medical problems, these are probably not your best option.  No Primary Care Doctor: - Call Health Connect at  [phone] - they can help you locate a primary care doctor that  accepts your insurance, provides certain services, etc. - Physician Referral Service- [phone]  Chronic Pain Problems: Organization         Address  Phone   Notes  [facility]  [phone] Patients need to be referred by their primary care doctor.   Medication Assistance: Organization         Address  Phone   Notes  [facility] Medication [facility] [address] [place] [place], [place] [address] [phone] --Must be a resident of [facility] -- Must have NO insurance coverage whatsoever (no Medicaid/ Medicare, etc.) -- The pt. MUST have a primary care doctor that directs their care regularly and follows them in the community   [facility]  [phone]   [employer]  [phone]    Agencies that provide inexpensive medical care: Organization         Address  Phone   Notes  [facility]  [phone]   [facility]    [phone]   [facility] [address] [place], [place] [address] [phone]   [facility] [address] [place] [place] [phone]   [facility]anned Parenthood    [phone]   [facility]    [phone]   Community Health and [facility]  [address], [place] Phone:  [phone], Fax:  [phone] Hours of Operation:  9 am - 6 pm, M-F.  Also accepts Medicaid/Medicare and self-pay.  [facility]  [address], [place] Phone: [phone], Fax: [phone]. Hours of Operation:  8:30 am - 5:30 pm, M-F.  Also accepts Medicaid  and self-pay.  [facility] [place] [address] [place] Phone: [phone]   [facility] [address] Natasha Bence [place], [place] [phone], Ext. [phone] Mondays & Thursdays: 7-9 AM.  First 15 patients are seen on a first come, first serve basis.    Medicaid-accepting [facility] Providers:  Organization         Address  Phone   Notes  [facility] [address], [place] [phone] Also accepts self-pay patients.  [facility] [address] Laurell Josephs [place], [place]  [phone]   [facility] [address], [place] [phone]   [facility] [address] [place] [phone]   Renaye Rakers [address], [place]   [phone] Only accepts [place]  Access Medicaid patients after they have their name applied to their card.   Self-Pay (no insurance) in [facility]:  Organization         Address  Phone   Notes  Sickle Cell Patients, [facility] [address] [place], [place] [phone]   [facility] [address] [place], [place] [phone]   [facility] [place]  [address], [place] [phone]   [facility]/Dr. Osei-Bonsu  [address] [place] or [address], [place] [phone] Phone number for both [place] and [place] locations is the same.  [facility] and [facility] [address], [place] [phone]   [facility] [address] [place] or [address] [phone]   [facility] [address] [place] [phone], phone; [phone], fax Sees patients 1st and 3rd Saturday of every month.  Must not qualify for public or private insurance (i.e. Medicaid, Medicare, [place] Health Choice, Veterans' Benefits)  Household income  should be no more than 200% of the poverty level The clinic cannot treat you if you are pregnant or think you are pregnant  Sexually transmitted diseases are not treated at the clinic.    Dental Care: Organization         Address  Phone  Notes  [facility] Department of [facility] [address] [place], [place] [phone] Accepts children up to age 84 who are enrolled in [place] or [place] Health Choice; pregnant women with a Medicaid card; and children who have applied for Medicaid or [place] Health Choice, but were declined, whose parents can pay a reduced fee at time of service.  [facility] Department of [facility]  [address], [place] [phone] Accepts children up to age 71 who are enrolled in [place] or [place] Health Choice; pregnant women with a Medicaid card; and children who have applied for Medicaid or [place] Health Choice, but were declined, whose parents can pay a reduced fee at time of service.  [facility]  [address] [place], [place] [phone] Patients are seen by appointment only. Walk-ins are not accepted. [facility] will see patients 60 years of age and older. Monday - Tuesday (8am-5pm) Most Wednesdays (8:30-5pm) $30 per visit, cash only  [facility]  [address], [facility] [phone] Patients are seen by appointment only. Walk-ins are not accepted. [facility] will see patients 97 years of age and older. One Wednesday Evening (Monthly: Volunteer Based).  $30 per visit, cash only  [employer]  [phone] for adults; Children under age 64, call Graduate Pediatric Dentistry at [phone]. Children aged 15-14, please call [phone] to request a pediatric application.  Dental services are provided in all areas of dental care including fillings, crowns and bridges, complete and partial dentures, implants, gum treatment,  root canals, and extractions. Preventive care is also provided. Treatment is provided to both adults and children. Patients are selected via a lottery and there is often a waiting list.   [facility] [address] [place]  [phone] [email or web address]   [facility] [address] [place], [place] [phone], Ext. [phone] Second and Fourth Thursday of each month, opens at 6:30 AM; Clinic ends at 9  AM.  Patients are seen on a first-come first-served basis, and a limited number are seen during each clinic.   [facility]  [address] Ether Griffins [place], [place] [phone]   Eligibility Requirements You must have lived in [place], [place], or [place] counties for at least the last three months.   You cannot be eligible for state or federal sponsored [place], including [employer], [place], or [employer].   You generally cannot be eligible for healthcare insurance through your employer.    How to apply: Eligibility screenings are held every Tuesday and Wednesday afternoon from 1:00 pm until 4:00 pm. You do not need an appointment for the interview!  [facility] [address] [place], [place] [phone]   [facility] Health [facility]  [phone]   [facility]  [phone]   [facility] Health Department  [phone]    Behavioral Health Resources in the Community: Intensive Outpatient Programs Organization         Address  Phone  Notes  [facility] [address], [place], [place] [phone]   [facility] [address], [place], [place] [phone]   [facility] [address] [place], [place]  [phone]   [facility] Mental Health [address],  [place], [place] [phone] or [phone]   Substance Abuse Resources Organization         Address  Phone  Notes  [facility]   [phone]   [facility]  [phone]   [facility] [place]  [phone]   Floydene Flock  [phone]   Residential & Outpatient Substance Abuse Program  [phone]   Psychological Services Organization         Address  Phone  Notes  [facility] Health  [phone]   [facility] [place] [facility] Services  [phone]   [facility] Mental Health [address], [place] [phone] or [phone]    Mobile Crisis Teams Organization         Address  Phone  Notes  [facility], Mobile Crisis Care Unit  [phone]   [facility]  [address] [place], [place] [phone]   Doristine Locks [address] [place] [phone]    Self-Help/Support Groups Organization         Address  Phone             Notes  [facility] [place] - variety of support groups  336- I7437963 Call for more information  Narcotics Anonymous (NA), [facility] [address], [employer] [place]  2 meetings at this location   Statistician         Address  Phone  Notes  [facility] [address],    [place]  [phone]   [facility]  [address] [place] [address], [place], [place] [phone]   [facility] [address] [place], [place] [phone] Admissions: 8am-3pm M-F  [facility] [address],    [place], [place] [phone]   [facility] [address] Starling Manns [place], [place] [phone]   [facility] [address],  [place], [place] [phone]   [facility] - Intensive Outpatient [address] Laurell Joseph[address], [place], [place] [phone]   [facility] ([facility] [address]  [place], [place] [phone] or 7542822555   [facility] ([facility])  [address], [place], [place] [phone] Accepts Medicaid  [facility] [address],  [place] [phone] Substance Abuse/Addiction Treatment   [facility] Organization         Address  Phone  Notes  [facility]  [phone]   Angie Fava, PhD [address] Ervin Knack [place], [place]   [phone] or 5080055128   [facility] Behavioral   [address] [place], [place] [phone]   [facility] [address], [place], [place] [phone] Insurance/Medicaid/sponsorship through [facility] and Families [address]                                    [place], [place] [phone] Therapy/tele-psych/case  [facility] [address], [place] [phone]    Dr. Lolly Mustache  [phone]   [facility]  [employer] [facility] Dept. 1) [address], [place] 2) [address], [place] 3)  [address], [place] [phone]  [phone]   [facility] Child Abuse Hotline [phone] or [phone] (After Hours)

## 2014-08-02 NOTE — ED Provider Notes (Signed)
CSN: [id number]     Arrival date & time 08/02/14  0107 History  This chart was scribed for Marisa Severin, MD by Evon Slack, ED Scribe. This patient was seen in room A12C/A12C and the patient's care was started at 1:31 AM.      Chief Complaint  Patient presents with  . Dental Pain  . Abscess   The history is provided by the patient. No language interpreter was used.   HPI Comments: Charles Atkins is a 34 y.o. male who presents to the Emergency Department complaining of worsening dental pain onset 1 week prior. Pt has associated left sided facial swelling, nausea, vomiting, chills and fever. Pt states that he has been placed on antibiotics and pain medication with no relief. Pt doesn't report any other symptoms. Pt states that he is going to try and get a dentist appointment in the morning.  [facility] [place] and started on penicillin.  Since that time the swelling has worsened.  He has had nausea and vomiting, he has had difficulties eating and drinking   Past Medical History  Diagnosis Date  . Medical history non-contributory    Past Surgical History  Procedure Laterality Date  . No past surgeries    . Skin split graft Left 02/02/2014    Procedure: [facility] SPLIT THICKNESS SKIN GRAFT TO LEFT ELBOW;  Surgeon: Kathryne Hitch, MD;  [facility];  Service: Orthopedics;  Laterality: Left;   No family history on file. History  Substance Use Topics  . Smoking status: Never Smoker   . Smokeless tobacco: Not on file  . Alcohol Use: No    Review of Systems  Constitutional: Positive for fever.  HENT: Positive for dental problem and facial swelling.   Gastrointestinal: Positive for nausea and vomiting.  All other systems reviewed and are negative.    Allergies  Review of patient's allergies indicates no known allergies.  Home Medications   Prior to Admission medications   Medication Sig Start Date End Date Taking? Authorizing Provider   HYDROcodone-acetaminophen (NORCO) 5-325 MG per tablet Take 1-2 tablets by mouth every 6 (six) hours as needed. 07/30/14   Geoffery Lyons, MD  penicillin v potassium (VEETID) 500 MG tablet Take 1 tablet (500 mg total) by mouth 3 (three) times daily. 07/30/14   Geoffery Lyons, MD   BP 134/74 mmHg  Pulse 119  Temp(Src) 99.4 F (37.4 C) (Oral)  Resp 22  Ht 6\' 6"  (1.981 m)  Wt 207 lb 7 oz (94.093 kg)  BMI 23.98 kg/m2  SpO2 96%   Physical Exam  Constitutional: He is oriented to person, place, and time. He appears well-developed and well-nourished. He appears distressed.  HENT:  Head: Normocephalic and atraumatic.  Nose: Nose normal.  Mouth/Throat: Oropharynx is clear and moist.  Patient has diffuse dental disease.  He has a large amount of swelling lateral to left lower jaw line extending into the submental space and back towards the angle of the mandible.  There is diffuse lymphadenopathy.  There is fluctuance.  Eyes: Conjunctivae and EOM are normal. Pupils are equal, round, and reactive to light.  Neck: Normal range of motion. Neck supple. No JVD present. No tracheal deviation present. No thyromegaly present.  Cardiovascular: Regular rhythm, normal heart sounds and intact distal pulses.  Exam reveals no gallop and no friction rub.   No murmur heard. Tachycardia noted  Pulmonary/Chest: Effort normal and breath sounds normal. No stridor. No respiratory distress.  He has no wheezes. He has no rales. He exhibits no tenderness.  Abdominal: Soft. Bowel sounds are normal. He exhibits no distension and no mass. There is no tenderness. There is no rebound and no guarding.  Musculoskeletal: Normal range of motion. He exhibits no edema or tenderness.  Lymphadenopathy:    He has no cervical adenopathy.  Neurological: He is alert and oriented to person, place, and time. He displays normal reflexes. He exhibits normal muscle tone. Coordination normal.  Skin: Skin is warm. No rash noted. He is diaphoretic. No  erythema. No pallor.  Psychiatric: He has a normal mood and affect. His behavior is normal. Judgment and thought content normal.  Nursing note and vitals reviewed.   ED Course  Procedures (including critical care time) DIAGNOSTIC STUDIES: Oxygen Saturation is 96% on RA, adequate by my interpretation.    COORDINATION OF CARE: 1:51 AM-Discussed treatment plan with pt at bedside and pt agreed to plan.     Labs Review Labs Reviewed  BASIC METABOLIC PANEL - Abnormal; Notable for the following:    Potassium 3.4 (*)    Creatinine, Ser 1.35 (*)    All other components within normal limits  CBC WITH DIFFERENTIAL/PLATELET - Abnormal; Notable for the following:    Neutrophils Relative % 93 (*)    Lymphocytes Relative 6 (*)    Lymphs Abs 0.5 (*)    Monocytes Relative 1 (*)    All other components within normal limits  I-STAT CG4 LACTIC ACID, ED - Abnormal; Notable for the following:    Lactic Acid, Venous 4.07 (*)    All other components within normal limits    Imaging Review Ct Maxillofacial W/cm  08/02/2014   CLINICAL DATA:  Left-sided facial/ dental abscess, dental pain. Chills, nausea, vomiting and rigors. 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  EXAM: CT MAXILLOFACIAL WITH CONTRAST  TECHNIQUE: Multidetector CT imaging of the maxillofacial structures was performed with intravenous contrast. Multiplanar CT image reconstructions were also generated. A small metallic BB was placed on the right temple in order to reliably differentiate right from left.  CONTRAST:  80mL OMNIPAQUE IOHEXOL 300 MG/ML  SOLN  COMPARISON:  None.  FINDINGS: Dental carie involving the left lower first molar. There is associated skin thickening, soft tissue edema, and ill-defined fluid collections involving the or left side of face. Ill-defined fluid collection spans at least 3.5 cm, extending toward to the central mandibular ramus. This is primarily lingual in location, with some superficial fluid collections anteriorly which are  ill-defined. Multiple enlarged lymph nodes in the cervical chain, left greater than right likely reactive. Mild mass effect on the tongue and palatine tonsils. There is no retropharyngeal fluid collection. The left lower second molar is transversely oriented, with similar findings of the right lower second molar. The epiglottis is normal. There is no abnormality in the included portion of the brain.  IMPRESSION: 1. Dental carie involving the left lower first molar with associated minimal periapical lucency and adjacent fluid collections concerning for soft tissue periodontal abscess. Fluid collections are ill-defined, however a largest appears measures at least 3.5 cm and dimension. 2. No retropharyngeal fluid collection.   Electronically Signed   By: Rubye Oaks M.D.   On: 08/02/2014 04:35     EKG Interpretation None      MDM   Final diagnoses:  Dental abscess      I personally performed the services described in this documentation, which was scribed in my presence. The recorded information has been reviewed and is  accurate.  34 year old male with progressing dental abscess.  Plan for labs, CT max face with IV contrast.  We'll touch base with oral surgeon or dentist for close follow-up.  Patient received IV antibiotics, clindamycin pain and nausea control.  6:57 AM Unable to get in contact with Dr. Lucky Cowboy on call for dentistry.  Patient given follow-up instructions, will continue clindamycin.  Will give Percocet to help with pain control.  Patient and wife given a list of dental options, wife has dentist as well.  They will try to contact today.     Marisa Severin, MD 08/02/14 [phone]
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
## 2022-06-03 ENCOUNTER — Emergency Department (HOSPITAL_COMMUNITY): Payer: No Typology Code available for payment source

## 2022-06-03 ENCOUNTER — Emergency Department
Admission: EM | Admit: 2022-06-03 | Discharge: 2022-06-03 | Disposition: A | Discharge status: Home / Self Care | Payer: No Typology Code available for payment source | Attending: Emergency Medicine | Admitting: Emergency Medicine

## 2022-06-03 ENCOUNTER — Other Ambulatory Visit: Payer: Self-pay

## 2022-06-03 ENCOUNTER — Encounter (HOSPITAL_COMMUNITY): Payer: Self-pay

## 2022-06-03 DIAGNOSIS — R7989 Other specified abnormal findings of blood chemistry: Secondary | ICD-10-CM | POA: Insufficient documentation

## 2022-06-03 DIAGNOSIS — N179 Acute kidney failure, unspecified: Secondary | ICD-10-CM | POA: Insufficient documentation

## 2022-06-03 DIAGNOSIS — S40019A Contusion of unspecified shoulder, initial encounter: Secondary | ICD-10-CM

## 2022-06-03 DIAGNOSIS — S40211A Abrasion of right shoulder, initial encounter: Secondary | ICD-10-CM | POA: Insufficient documentation

## 2022-06-03 DIAGNOSIS — Z23 Encounter for immunization: Secondary | ICD-10-CM | POA: Insufficient documentation

## 2022-06-03 DIAGNOSIS — S40011A Contusion of right shoulder, initial encounter: Secondary | ICD-10-CM | POA: Insufficient documentation

## 2022-06-03 DIAGNOSIS — I440 Atrioventricular block, first degree: Secondary | ICD-10-CM | POA: Insufficient documentation

## 2022-06-03 DIAGNOSIS — S20219A Contusion of unspecified front wall of thorax, initial encounter: Secondary | ICD-10-CM | POA: Insufficient documentation

## 2022-06-03 DIAGNOSIS — T148XXA Other injury of unspecified body region, initial encounter: Secondary | ICD-10-CM

## 2022-06-03 HISTORY — DX: Other specified health status: Z78.9

## 2022-06-03 LAB — CBC WITH DIFF
BASOPHIL #: 0 10*3/uL (ref 0.00–0.10)
BASOPHIL %: 0 % (ref 0–1)
EOSINOPHIL #: 0 10*3/uL (ref 0.00–0.50)
EOSINOPHIL %: 0 % — ABNORMAL LOW
HCT: 45.9 % (ref 36.7–47.1)
HGB: 15.9 g/dL (ref 12.5–16.3)
LYMPHOCYTE #: 1.3 10*3/uL (ref 1.00–3.00)
LYMPHOCYTE %: 9 % — ABNORMAL LOW (ref 16–44)
MCH: 31 pg (ref 23.8–33.4)
MCHC: 34.7 g/dL (ref 32.5–36.3)
MCV: 89.2 fL (ref 73.0–96.2)
MONOCYTE #: 1.2 10*3/uL — ABNORMAL HIGH (ref 0.30–1.00)
MONOCYTE %: 8 % (ref 5–13)
MPV: 9 fL (ref 7.4–11.4)
NEUTROPHIL #: 12.1 10*3/uL — ABNORMAL HIGH (ref 1.85–7.80)
NEUTROPHIL %: 82 % — ABNORMAL HIGH (ref 43–77)
PLATELETS: 198 10*3/uL (ref 140–440)
RBC: 5.15 10*6/uL (ref 4.06–5.63)
RDW: 13.9 % (ref 12.1–16.2)
WBC: 14.7 10*3/uL — ABNORMAL HIGH (ref 3.6–10.2)

## 2022-06-03 LAB — COMPREHENSIVE METABOLIC PANEL, NON-FASTING
ALBUMIN/GLOBULIN RATIO: 2.1 — ABNORMAL HIGH (ref 0.8–1.4)
ALBUMIN: 4.5 g/dL (ref 3.5–5.7)
ALKALINE PHOSPHATASE: 52 U/L (ref 34–104)
ALT (SGPT): 32 U/L (ref 7–52)
ANION GAP: 8 mmol/L (ref 4–13)
AST (SGOT): 54 U/L — ABNORMAL HIGH (ref 13–39)
BILIRUBIN TOTAL: 0.6 mg/dL (ref 0.3–1.2)
BUN/CREA RATIO: 15 (ref 6–22)
BUN: 26 mg/dL — ABNORMAL HIGH (ref 7–25)
CALCIUM, CORRECTED: 9 mg/dL (ref 8.9–10.8)
CALCIUM: 9.4 mg/dL (ref 8.6–10.3)
CHLORIDE: 105 mmol/L (ref 98–107)
CO2 TOTAL: 29 mmol/L (ref 21–31)
CREATININE: 1.69 mg/dL — ABNORMAL HIGH (ref 0.60–1.30)
ESTIMATED GFR: 52 mL/min/{1.73_m2} — ABNORMAL LOW (ref >59)
GLOBULIN: 2.1 — ABNORMAL LOW (ref 2.9–5.4)
GLUCOSE: 98 mg/dL (ref 74–109)
OSMOLALITY, CALCULATED: 288 mOsm/kg (ref 270–290)
POTASSIUM: 4 mmol/L (ref 3.5–5.1)
PROTEIN TOTAL: 6.6 g/dL (ref 6.4–8.9)
SODIUM: 142 mmol/L (ref 136–145)

## 2022-06-03 LAB — URINALYSIS, MACROSCOPIC
BILIRUBIN: NEGATIVE mg/dL
BLOOD: 0.5 mg/dL — AB
GLUCOSE: NEGATIVE mg/dL
KETONES: NEGATIVE mg/dL
LEUKOCYTES: 250 WBCs/uL — AB
NITRITE: NEGATIVE
PH: 7 (ref 5.0–9.0)
PROTEIN: 30 mg/dL — AB
SPECIFIC GRAVITY: <1.005 (ref 1.002–1.030)
UROBILINOGEN: NORMAL mg/dL

## 2022-06-03 LAB — ETHANOL, SERUM/PLASMA: ETHANOL: <10 mg/dL — ABNORMAL HIGH

## 2022-06-03 LAB — BLUE TOP TUBE

## 2022-06-03 LAB — URINALYSIS, MICROSCOPIC
RBCS: 48 /hpf — ABNORMAL HIGH (ref <4)
SQUAMOUS EPITHELIAL: 1 /hpf (ref <28)
WBCS: 38 /hpf — ABNORMAL HIGH (ref <6)

## 2022-06-03 LAB — GOLD TOP TUBE

## 2022-06-03 LAB — GRAY TOP TUBE

## 2022-06-03 LAB — LIPASE: LIPASE: 42 U/L (ref 11–82)

## 2022-06-03 MED ORDER — ACETAMINOPHEN 325 MG TABLET
ORAL_TABLET | ORAL | Status: AC
Start: 2022-06-03 — End: 2022-06-03
  Filled 2022-06-03: qty 2

## 2022-06-03 MED ORDER — ONDANSETRON HCL (PF) 4 MG/2 ML INJECTION SOLUTION
4.0000 mg | INTRAMUSCULAR | Status: AC
Start: 2022-06-03 — End: 2022-06-03
  Administered 2022-06-03: 4 mg via INTRAVENOUS

## 2022-06-03 MED ORDER — IOHEXOL 350 MG IODINE/ML INTRAVENOUS SOLUTION
100.0000 mL | INTRAVENOUS | Status: AC
Start: 2022-06-03 — End: 2022-06-03
  Administered 2022-06-03: 100 mL via INTRAVENOUS

## 2022-06-03 MED ORDER — MORPHINE 4 MG/ML INJECTION WRAPPER
INJECTION | INTRAMUSCULAR | Status: AC
Start: 2022-06-03 — End: 2022-06-03
  Filled 2022-06-03: qty 1

## 2022-06-03 MED ORDER — DIPHTH,PERTUSSIS(ACEL),TETANUS 2.5 LF UNIT-8 MCG-5 LF/0.5ML IM SYRINGE
0.5000 mL | INJECTION | INTRAMUSCULAR | Status: AC
Start: 2022-06-03 — End: 2022-06-03
  Administered 2022-06-03: 0.5 mL via INTRAMUSCULAR

## 2022-06-03 MED ORDER — ONDANSETRON HCL (PF) 4 MG/2 ML INJECTION SOLUTION
INTRAMUSCULAR | Status: AC
Start: 2022-06-03 — End: 2022-06-03
  Filled 2022-06-03: qty 2

## 2022-06-03 MED ORDER — MORPHINE 4 MG/ML INJECTION WRAPPER
4.0000 mg | INJECTION | INTRAMUSCULAR | Status: AC
Start: 2022-06-03 — End: 2022-06-03
  Administered 2022-06-03: 4 mg via INTRAVENOUS

## 2022-06-03 MED ORDER — ACETAMINOPHEN 325 MG TABLET
650.0000 mg | ORAL_TABLET | ORAL | Status: AC
Start: 2022-06-03 — End: 2022-06-03
  Administered 2022-06-03: 650 mg via ORAL

## 2022-06-03 MED ORDER — SODIUM CHLORIDE 0.9 % IV BOLUS
1000.0000 mL | INJECTION | Status: AC
Start: 2022-06-03 — End: 2022-06-03
  Administered 2022-06-03: 0 mL via INTRAVENOUS
  Administered 2022-06-03: 1000 mL via INTRAVENOUS

## 2022-06-03 MED ORDER — DIPHTH,PERTUSSIS(ACEL),TETANUS 2.5 LF UNIT-8 MCG-5 LF/0.5ML IM SYRINGE
INJECTION | INTRAMUSCULAR | Status: AC
Start: 2022-06-03 — End: 2022-06-03
  Filled 2022-06-03: qty 0.5
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
## 2022-06-03 NOTE — ED Nurses Note (Signed)
Patient discharged home. Driver waiting outside hospital. AVS reviewed with patient/care giver.  A written copy of the AVS and discharge instructions was given to the patient/care giver.  Questions sufficiently answered as needed.  Patient/care giver encouraged to follow up with PCP as indicated.  In the event of an emergency, patient/care giver instructed to call 911 or go to the nearest emergency room.

## 2022-06-03 NOTE — ED Nurses Note (Signed)
Patient discharged home. Driver waiting outside hospital. AVS reviewed with patient/care giver.  A written copy of the AVS and discharge instructions was given to the patient/care giver.  Questions sufficiently answered as needed.  Patient/care giver encouraged to follow up with PCP as indicated.  In the event of an emergency, patient/care giver instructed to call 911 or go to the nearest emergency room.

## 2022-06-03 NOTE — Discharge Instructions (Signed)
Use tylenol and motrin for pain. Voltaren gel may help on your chest. Return for any problems.   Follow up with your doctor.
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
## 2022-06-05 DIAGNOSIS — I440 Atrioventricular block, first degree: Secondary | ICD-10-CM

## 2022-06-05 DIAGNOSIS — R9431 Abnormal electrocardiogram [ECG] [EKG]: Secondary | ICD-10-CM

## 2022-06-05 LAB — ECG 12 LEAD
Atrial Rate: 87 {beats}/min
Calculated P Axis: 74 degrees
Calculated R Axis: 93 degrees
Calculated T Axis: 45 degrees
PR Interval: 224 ms
QRS Duration: 100 ms
QT Interval: 354 ms
QTC Calculation: 425 ms
Ventricular rate: 87 {beats}/min

## 2022-06-05 LAB — URINE CULTURE,ROUTINE: URINE CULTURE: 10000 — AB
